# Patient Record
Sex: Female | Born: 1960 | Race: White | Hispanic: No | Marital: Married | Smoking: Never smoker
Health system: Southern US, Community
[De-identification: ages and names within clinical notes are randomized; demographics above are authoritative.]

## PROBLEM LIST (undated history)

## (undated) DIAGNOSIS — N189 Chronic kidney disease, unspecified: Secondary | ICD-10-CM

## (undated) HISTORY — PX: BREAST SURGERY: SHX581

## (undated) HISTORY — PX: ABDOMINAL HYSTERECTOMY: SHX81

---
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
## 1998-10-20 ENCOUNTER — Other Ambulatory Visit: Admission: RE | Admit: 1998-10-20 | Discharge: 1998-10-20 | Payer: Self-pay | Admitting: Obstetrics & Gynecology

## 1999-12-03 ENCOUNTER — Other Ambulatory Visit: Admission: RE | Admit: 1999-12-03 | Discharge: 1999-12-03 | Payer: Self-pay | Admitting: Obstetrics & Gynecology

## 2001-02-03 ENCOUNTER — Other Ambulatory Visit: Admission: RE | Admit: 2001-02-03 | Discharge: 2001-02-03 | Payer: Self-pay | Admitting: Obstetrics and Gynecology

## 2003-03-04 ENCOUNTER — Other Ambulatory Visit: Admission: RE | Admit: 2003-03-04 | Discharge: 2003-03-04 | Payer: Self-pay | Admitting: Obstetrics and Gynecology

## 2004-05-04 ENCOUNTER — Other Ambulatory Visit: Admission: RE | Admit: 2004-05-04 | Discharge: 2004-05-04 | Payer: Self-pay | Admitting: Obstetrics and Gynecology

## 2004-08-20 ENCOUNTER — Encounter: Admission: RE | Admit: 2004-08-20 | Discharge: 2004-08-20 | Payer: Self-pay | Admitting: Obstetrics and Gynecology

## 2005-06-02 ENCOUNTER — Other Ambulatory Visit: Admission: RE | Admit: 2005-06-02 | Discharge: 2005-06-02 | Payer: Self-pay | Admitting: Obstetrics and Gynecology

## 2011-01-10 ENCOUNTER — Encounter: Payer: Self-pay | Admitting: Obstetrics and Gynecology

## 2015-12-25 ENCOUNTER — Encounter (HOSPITAL_COMMUNITY): Payer: Self-pay | Admitting: Emergency Medicine

## 2015-12-25 ENCOUNTER — Emergency Department (HOSPITAL_COMMUNITY)
Admission: EM | Admit: 2015-12-25 | Discharge: 2015-12-25 | Disposition: A | Discharge status: Home / Self Care | Payer: BLUE CROSS/BLUE SHIELD | Attending: Emergency Medicine | Admitting: Emergency Medicine

## 2015-12-25 ENCOUNTER — Emergency Department (HOSPITAL_COMMUNITY): Payer: BLUE CROSS/BLUE SHIELD

## 2015-12-25 DIAGNOSIS — N390 Urinary tract infection, site not specified: Secondary | ICD-10-CM | POA: Insufficient documentation

## 2015-12-25 DIAGNOSIS — N200 Calculus of kidney: Secondary | ICD-10-CM | POA: Insufficient documentation

## 2015-12-25 LAB — CBC WITH DIFFERENTIAL/PLATELET
Basophils Absolute: 0 10*3/uL (ref 0.0–0.1)
Basophils Relative: 1 %
Eosinophils Absolute: 0.2 10*3/uL (ref 0.0–0.7)
Eosinophils Relative: 3 %
HEMATOCRIT: 37.2 % (ref 36.0–46.0)
HEMOGLOBIN: 12.2 g/dL (ref 12.0–15.0)
LYMPHS ABS: 1.3 10*3/uL (ref 0.7–4.0)
LYMPHS PCT: 28 %
MCH: 31.8 pg (ref 26.0–34.0)
MCHC: 32.8 g/dL (ref 30.0–36.0)
MCV: 96.9 fL (ref 78.0–100.0)
MONOS PCT: 8 %
Monocytes Absolute: 0.4 10*3/uL (ref 0.1–1.0)
NEUTROS ABS: 2.9 10*3/uL (ref 1.7–7.7)
NEUTROS PCT: 60 %
Platelets: 194 10*3/uL (ref 150–400)
RBC: 3.84 MIL/uL — AB (ref 3.87–5.11)
RDW: 12 % (ref 11.5–15.5)
WBC: 4.8 10*3/uL (ref 4.0–10.5)

## 2015-12-25 LAB — URINALYSIS, ROUTINE W REFLEX MICROSCOPIC
BILIRUBIN URINE: NEGATIVE
GLUCOSE, UA: NEGATIVE mg/dL
Hgb urine dipstick: NEGATIVE
KETONES UR: NEGATIVE mg/dL
NITRITE: POSITIVE — AB
PROTEIN: NEGATIVE mg/dL
Specific Gravity, Urine: 1.02 (ref 1.005–1.030)
pH: 7 (ref 5.0–8.0)

## 2015-12-25 LAB — URINE MICROSCOPIC-ADD ON: RBC / HPF: NONE SEEN RBC/hpf (ref 0–5)

## 2015-12-25 LAB — COMPREHENSIVE METABOLIC PANEL
ALK PHOS: 76 U/L (ref 38–126)
ALT: 11 U/L — AB (ref 14–54)
ANION GAP: 10 (ref 5–15)
AST: 20 U/L (ref 15–41)
Albumin: 4.3 g/dL (ref 3.5–5.0)
BILIRUBIN TOTAL: 0.4 mg/dL (ref 0.3–1.2)
BUN: 13 mg/dL (ref 6–20)
CALCIUM: 9.5 mg/dL (ref 8.9–10.3)
CO2: 26 mmol/L (ref 22–32)
CREATININE: 0.82 mg/dL (ref 0.44–1.00)
Chloride: 105 mmol/L (ref 101–111)
GFR calc non Af Amer: >60 mL/min (ref >60)
Glucose, Bld: 133 mg/dL — ABNORMAL HIGH (ref 65–99)
Potassium: 4.1 mmol/L (ref 3.5–5.1)
Sodium: 141 mmol/L (ref 135–145)
TOTAL PROTEIN: 6.7 g/dL (ref 6.5–8.1)

## 2015-12-25 MED ORDER — ACETAMINOPHEN 10 MG/ML IV SOLN
1000.0000 mg | Freq: Four times a day (QID) | INTRAVENOUS | Status: DC
  Administered 2015-12-25: 1000 mg via INTRAVENOUS
  Filled 2015-12-25 (×4): qty 100

## 2015-12-25 MED ORDER — ONDANSETRON HCL 4 MG/2ML IJ SOLN
4.0000 mg | Freq: Once | INTRAMUSCULAR | Status: AC
  Administered 2015-12-25: 4 mg via INTRAVENOUS
  Filled 2015-12-25: qty 2

## 2015-12-25 MED ORDER — DIPHENHYDRAMINE HCL 50 MG/ML IJ SOLN
25.0000 mg | Freq: Once | INTRAMUSCULAR | Status: AC
  Administered 2015-12-25: 25 mg via INTRAVENOUS
  Filled 2015-12-25: qty 1

## 2015-12-25 MED ORDER — HYDROMORPHONE HCL 1 MG/ML IJ SOLN
1.0000 mg | Freq: Once | INTRAMUSCULAR | Status: AC
  Administered 2015-12-25: 1 mg via INTRAVENOUS
  Filled 2015-12-25: qty 1

## 2015-12-25 MED ORDER — HYDROMORPHONE HCL 1 MG/ML IJ SOLN
1.0000 mg | Freq: Once | INTRAMUSCULAR | Status: AC
  Administered 2015-12-25: 1 mg via INTRAVENOUS

## 2015-12-25 MED ORDER — KETOROLAC TROMETHAMINE 30 MG/ML IJ SOLN
30.0000 mg | Freq: Once | INTRAMUSCULAR | Status: AC
  Administered 2015-12-25: 30 mg via INTRAVENOUS
  Filled 2015-12-25: qty 1

## 2015-12-25 MED ORDER — OXYCODONE-ACETAMINOPHEN 5-325 MG PO TABS
1.0000 | ORAL_TABLET | Freq: Once | ORAL | Status: AC
  Administered 2015-12-25: 2 via ORAL

## 2015-12-25 MED ORDER — HYDROCODONE-ACETAMINOPHEN 5-325 MG PO TABS: 1.0000 | ORAL_TABLET | Freq: Four times a day (QID) | ORAL | Status: AC | PRN

## 2015-12-25 MED ORDER — OXYCODONE-ACETAMINOPHEN 5-325 MG PO TABS
ORAL_TABLET | ORAL | Status: AC
  Filled 2015-12-25: qty 2

## 2015-12-25 MED ORDER — CEPHALEXIN 500 MG PO CAPS: 500.0000 mg | ORAL_CAPSULE | Freq: Four times a day (QID) | ORAL | Status: DC

## 2015-12-25 MED ORDER — HYDROMORPHONE HCL 1 MG/ML IJ SOLN
0.5000 mg | Freq: Once | INTRAMUSCULAR | Status: DC
  Filled 2015-12-25: qty 1

## 2015-12-25 MED ORDER — ONDANSETRON HCL 4 MG PO TABS: 4.0000 mg | ORAL_TABLET | Freq: Four times a day (QID) | ORAL | Status: AC

## 2015-12-25 NOTE — ED Provider Notes (Signed)
CSN: [id number]     Arrival date & time 12/25/15  1032 History  By signing my name below, I, Ellen Haney, attest that this documentation has been prepared under the direction and in the presence of Ellen Horsemanobert Bethenny Losee, PA-C Electronically Signed: Charline BillsEssence Haney, ED Scribe 12/25/2015 at 1:49 PM.   Chief Complaint  Patient presents with  . Abdominal Pain   The history is provided by the patient. No language interpreter was used.   HPI Comments: Ellen Haney is a 55 y.o. female who presents to the Emergency Department with a chief complaint of sudden onset of constant left-sided abdominal pain that radiates into her left back onset 1 hour ago. Pt reports associated nausea and urine malodor. She denies dysuria. Pt reports h/o kindey stones and states that this feels similar. Previous surgeries include hysterectomy and caesarean section.   History reviewed. No pertinent past medical history. History reviewed. No pertinent past surgical history. History reviewed. No pertinent family history. Social History  Substance Use Topics  . Smoking status: Never Smoker   . Smokeless tobacco: None  . Alcohol Use: No   OB History    No data available     Review of Systems  Gastrointestinal: Positive for nausea and abdominal pain.  Genitourinary: Negative for dysuria.  Musculoskeletal: Positive for back pain.   Allergies  Review of patient's allergies indicates not on file.  Home Medications   Prior to Admission medications   Not on File   Temp(Src) 97.6 F (36.4 C) (Oral)  Ht 5\' 4"  (1.626 m)  Wt 118 lb (53.524 kg)  BMI 20.24 kg/m2  SpO2 99%  LMP  Physical Exam  Constitutional: She is oriented to person, place, and time. She appears well-developed and well-nourished.  HENT:  Head: Normocephalic and atraumatic.  Eyes: Conjunctivae and EOM are normal. Pupils are equal, round, and reactive to light.  Neck: Normal range of motion. Neck supple.  Cardiovascular: Normal rate and regular rhythm.   Exam reveals no gallop and no friction rub.   No murmur heard. Pulmonary/Chest: Effort normal and breath sounds normal. No respiratory distress. She has no wheezes. She has no rales. She exhibits no tenderness.  Abdominal: Soft. Bowel sounds are normal. She exhibits no distension and no mass. There is no tenderness. There is no rebound and no guarding.  No focal abdominal tenderness, no RLQ tenderness or pain at McBurney's point, no RUQ tenderness or Murphy's sign, no left-sided abdominal tenderness, no fluid wave, or signs of peritonitis  Left CVA tenderness   Musculoskeletal: Normal range of motion. She exhibits no edema or tenderness.  Neurological: She is alert and oriented to person, place, and time.  Skin: Skin is warm and dry.  Psychiatric: She has a normal mood and affect. Her behavior is normal. Judgment and thought content normal.  Nursing note and vitals reviewed.  ED Course  Procedures (including critical care time) DIAGNOSTIC STUDIES: Oxygen Saturation is 99% on RA, normal by my interpretation.    COORDINATION OF CARE: 11:22 AM-Discussed treatment plan which includes CBC, UA, CT abdomen pelvis, Zofran, Percocet, Dilaudid, Toradol injection and Ofirmev with pt at bedside and pt agreed to plan.   Labs Review Labs Reviewed  CBC WITH DIFFERENTIAL/PLATELET - Abnormal; Notable for the following:    RBC 3.84 (*)    All other components within normal limits  COMPREHENSIVE METABOLIC PANEL - Abnormal; Notable for the following:    Glucose, Bld 133 (*)    ALT 11 (*)    All  other components within normal limits  URINALYSIS, ROUTINE W REFLEX MICROSCOPIC (NOT AT [facility]) - Abnormal; Notable for the following:    APPearance CLOUDY (*)    Nitrite POSITIVE (*)    Leukocytes, UA SMALL (*)    All other components within normal limits  URINE MICROSCOPIC-ADD ON - Abnormal; Notable for the following:    Squamous Epithelial / LPF 0-5 (*)    Bacteria, UA MANY (*)    All other components  within normal limits   Imaging Review Ct Abdomen Pelvis Wo Contrast  12/25/2015  CLINICAL DATA:  Left flank pain EXAM: CT ABDOMEN AND PELVIS WITHOUT CONTRAST TECHNIQUE: Multidetector CT imaging of the abdomen and pelvis was performed following the standard protocol without IV contrast. COMPARISON:  None. FINDINGS: Lower chest: Mild scarring in the lung bases and right middle lobe. No infiltrate or effusion. Heart size within normal limits. Hepatobiliary: Liver normal in size and contour without focal lesion. Gallbladder and bile ducts normal. Pancreas: Negative Spleen: Negative Adrenals/Urinary Tract: Moderate hydronephrosis on the left. Left ureter is dilated. 3 x 10 mm stone is present in the distal left ureter causing obstruction. This is present at the left UVJ. No obstruction of the right kidney. Nephrocalcinosis is present in the bilaterally right greater than left. No renal mass. Urinary bladder empty. Stomach/Bowel: Negative for bowel obstruction. No bowel edema or mass. Normal appendix. Vascular/Lymphatic: Normal aorta and iliacs.  No lymphadenopathy Reproductive: Mild to moderate prostate enlargement. Other: Negative for free fluid Musculoskeletal: No fracture or bone lesion. IMPRESSION: 3 x 10 mm stone distal left ureter causing obstruction of the left kidney. Bilateral nephrocalcinosis right greater than left. Electronically Signed   By: Marlan Palau M.D.   On: 12/25/2015 13:08   I have personally reviewed and evaluated these images and lab results as part of my medical decision-making.   EKG Interpretation None      MDM   Final diagnoses:  Kidney stone  UTI (lower urinary tract infection)    Patient with sudden onset left flank pain this morning. She has a history of kidney stones. States that this feels similar.  CT scan remarkable for 3 x 10 mm left distally ureteral stone. I discussed this with Dr. Verdie Mosher, who recommends consultation with urology.  Pain is initially well  controlled with Dilaudid, however after about 30 minutes, and the pain is back and severe.  We'll give a second dose of Dilaudid.  Appreciate telephone consultation with Dr. Vernie Ammons, who recommends giving Toradol and IV Tylenol. If pain is still intolerable, we'll call him back, otherwise follow-up in his office in the next couple of days. I reviewed the UA with Dr. Vernie Ammons as well, who recommends treatment for UTI, but believes the infection to be beneath the stone, so not infected calculus. No fevers, chills, leukocytosis, or AKI.    Patient has good pain control after toradol, but had itching.  Patient given benadryl.  IV tylenol held because of allergic reaction to toradol.  Will wait and reassess.  3:14 PM Patient pain still well controlled.  Will DC IV tylenol.  I personally performed the services described in this documentation, which was scribed in my presence. The recorded information has been reviewed and is accurate.      Ellen Horseman, PA-C 12/25/15 1515  Lavera Guise, MD 12/26/15 0630

## 2015-12-25 NOTE — ED Notes (Signed)
Patient transported to CT 

## 2015-12-25 NOTE — Discharge Instructions (Signed)
Kidney Stones °Kidney stones (urolithiasis) are deposits that form inside your kidneys. The intense pain is caused by the stone moving through the urinary tract. When the stone moves, the ureter goes into spasm around the stone. The stone is usually passed in the urine.  °CAUSES  °· A disorder that makes certain neck glands produce too much parathyroid hormone (primary hyperparathyroidism). °· A buildup of uric acid crystals, similar to gout in your joints. °· Narrowing (stricture) of the ureter. °· A kidney obstruction present at birth (congenital obstruction). °· Previous surgery on the kidney or ureters. °· Numerous kidney infections. °SYMPTOMS  °· Feeling sick to your stomach (nauseous). °· Throwing up (vomiting). °· Blood in the urine (hematuria). °· Pain that usually spreads (radiates) to the groin. °· Frequency or urgency of urination. °DIAGNOSIS  °· Taking a history and physical exam. °· Blood or urine tests. °· CT scan. °· Occasionally, an examination of the inside of the urinary bladder (cystoscopy) is performed. °TREATMENT  °· Observation. °· Increasing your fluid intake. °· Extracorporeal shock wave lithotripsy--This is a noninvasive procedure that uses shock waves to break up kidney stones. °· Surgery may be needed if you have severe pain or persistent obstruction. There are various surgical procedures. Most of the procedures are performed with the use of small instruments. Only small incisions are needed to accommodate these instruments, so recovery time is minimized. °The size, location, and chemical composition are all important variables that will determine the proper choice of action for you. Talk to your health care provider to better understand your situation so that you will minimize the risk of injury to yourself and your kidney.  °HOME CARE INSTRUCTIONS  °· Drink enough water and fluids to keep your urine clear or pale yellow. This will help you to pass the stone or stone fragments. °· Strain  all urine through the provided strainer. Keep all particulate matter and stones for your health care provider to see. The stone causing the pain may be as small as a grain of salt. It is very important to use the strainer each and every time you pass your urine. The collection of your stone will allow your health care provider to analyze it and verify that a stone has actually passed. The stone analysis will often identify what you can do to reduce the incidence of recurrences. °· Only take over-the-counter or prescription medicines for pain, discomfort, or fever as directed by your health care provider. °· Keep all follow-up visits as told by your health care provider. This is important. °· Get follow-up X-rays if required. The absence of pain does not always mean that the stone has passed. It may have only stopped moving. If the urine remains completely obstructed, it can cause loss of kidney function or even complete destruction of the kidney. It is your responsibility to make sure X-rays and follow-ups are completed. Ultrasounds of the kidney can show blockages and the status of the kidney. Ultrasounds are not associated with any radiation and can be performed easily in a matter of minutes. °· Make changes to your daily diet as told by your health care provider. You may be told to: °¨ Limit the amount of salt that you eat. °¨ Eat 5 or more servings of fruits and vegetables each day. °¨ Limit the amount of meat, poultry, fish, and eggs that you eat. °· Collect a 24-hour urine sample as told by your health care provider. You may need to collect another urine sample every 6-12   months. °SEEK MEDICAL CARE IF: °· You experience pain that is progressive and unresponsive to any pain medicine you have been prescribed. °SEEK IMMEDIATE MEDICAL CARE IF:  °· Pain cannot be controlled with the prescribed medicine. °· You have a fever or shaking chills. °· The severity or intensity of pain increases over 18 hours and is not  relieved by pain medicine. °· You develop a new onset of abdominal pain. °· You feel faint or pass out. °· You are unable to urinate. °  °This information is not intended to replace advice given to you by your health care provider. Make sure you discuss any questions you have with your health care provider. °  °Document Released: 12/06/2005 Document Revised: 08/27/2015 Document Reviewed: 05/09/2013 °Elsevier Interactive Patient Education ©2016 [employer] ° °

## 2015-12-25 NOTE — ED Notes (Signed)
PT reports itching over entire body. Orders given for IV meds.

## 2015-12-25 NOTE — ED Notes (Signed)
Patient states left sided abd and back back for past hour

## 2015-12-25 NOTE — ED Notes (Signed)
Patient ambulated to bathroom and around department without difficulty

## 2015-12-26 ENCOUNTER — Ambulatory Visit (HOSPITAL_BASED_OUTPATIENT_CLINIC_OR_DEPARTMENT_OTHER)
Admission: RE | Admit: 2015-12-26 | Discharge: 2015-12-26 | Disposition: A | Discharge status: Home / Self Care | Payer: BLUE CROSS/BLUE SHIELD | Source: Ambulatory Visit | Attending: Urology | Admitting: Urology

## 2015-12-26 ENCOUNTER — Encounter (HOSPITAL_BASED_OUTPATIENT_CLINIC_OR_DEPARTMENT_OTHER): Payer: Self-pay | Admitting: *Deleted

## 2015-12-26 ENCOUNTER — Ambulatory Visit (HOSPITAL_BASED_OUTPATIENT_CLINIC_OR_DEPARTMENT_OTHER): Payer: BLUE CROSS/BLUE SHIELD | Admitting: Anesthesiology

## 2015-12-26 ENCOUNTER — Other Ambulatory Visit: Payer: Self-pay | Admitting: Urology

## 2015-12-26 ENCOUNTER — Encounter (HOSPITAL_BASED_OUTPATIENT_CLINIC_OR_DEPARTMENT_OTHER)
Admission: RE | Disposition: A | Discharge status: Home / Self Care | Payer: Self-pay | Source: Ambulatory Visit | Attending: Urology

## 2015-12-26 DIAGNOSIS — Z79891 Long term (current) use of opiate analgesic: Secondary | ICD-10-CM | POA: Diagnosis not present

## 2015-12-26 DIAGNOSIS — N201 Calculus of ureter: Secondary | ICD-10-CM | POA: Insufficient documentation

## 2015-12-26 DIAGNOSIS — Z841 Family history of disorders of kidney and ureter: Secondary | ICD-10-CM | POA: Diagnosis not present

## 2015-12-26 DIAGNOSIS — Z79899 Other long term (current) drug therapy: Secondary | ICD-10-CM | POA: Diagnosis not present

## 2015-12-26 DIAGNOSIS — Z87442 Personal history of urinary calculi: Secondary | ICD-10-CM | POA: Diagnosis not present

## 2015-12-26 DIAGNOSIS — N390 Urinary tract infection, site not specified: Secondary | ICD-10-CM | POA: Insufficient documentation

## 2015-12-26 HISTORY — PX: CYSTOSCOPY WITH URETEROSCOPY AND STENT PLACEMENT: SHX6377

## 2015-12-26 HISTORY — DX: Chronic kidney disease, unspecified: N18.9

## 2015-12-26 SURGERY — CYSTOURETEROSCOPY, WITH STENT INSERTION
Anesthesia: General | Site: Ureter | Laterality: Left

## 2015-12-26 MED ORDER — LACTATED RINGERS IV SOLN
INTRAVENOUS | Status: DC
  Administered 2015-12-26 (×2): via INTRAVENOUS
  Filled 2015-12-26: qty 1000

## 2015-12-26 MED ORDER — PHENYLEPHRINE HCL 10 MG/ML IJ SOLN
INTRAMUSCULAR | Status: DC | PRN
  Administered 2015-12-26: 40 ug via INTRAVENOUS

## 2015-12-26 MED ORDER — FENTANYL CITRATE (PF) 100 MCG/2ML IJ SOLN
INTRAMUSCULAR | Status: AC
  Filled 2015-12-26: qty 2

## 2015-12-26 MED ORDER — SODIUM CHLORIDE 0.9 % IR SOLN
Status: DC | PRN
  Administered 2015-12-26: 1000 mL
  Administered 2015-12-26: 3000 mL

## 2015-12-26 MED ORDER — CEFAZOLIN SODIUM-DEXTROSE 2-3 GM-% IV SOLR
INTRAVENOUS | Status: AC
  Filled 2015-12-26: qty 50

## 2015-12-26 MED ORDER — PROMETHAZINE HCL 25 MG/ML IJ SOLN
6.2500 mg | INTRAMUSCULAR | Status: DC | PRN
  Filled 2015-12-26: qty 1

## 2015-12-26 MED ORDER — MIDAZOLAM HCL 5 MG/5ML IJ SOLN
INTRAMUSCULAR | Status: DC | PRN
  Administered 2015-12-26: 2 mg via INTRAVENOUS

## 2015-12-26 MED ORDER — CIPROFLOXACIN IN D5W 400 MG/200ML IV SOLN
INTRAVENOUS | Status: AC
  Filled 2015-12-26: qty 200

## 2015-12-26 MED ORDER — HYDROMORPHONE HCL 1 MG/ML IJ SOLN
0.2500 mg | INTRAMUSCULAR | Status: DC | PRN
  Filled 2015-12-26: qty 1

## 2015-12-26 MED ORDER — FENTANYL CITRATE (PF) 100 MCG/2ML IJ SOLN
25.0000 ug | INTRAMUSCULAR | Status: AC | PRN
  Administered 2015-12-26 (×4): 25 ug via INTRAVENOUS
  Filled 2015-12-26: qty 0.5

## 2015-12-26 MED ORDER — PHENAZOPYRIDINE HCL 200 MG PO TABS
200.0000 mg | ORAL_TABLET | Freq: Three times a day (TID) | ORAL | Status: DC
  Administered 2015-12-26: 200 mg via ORAL
  Filled 2015-12-26: qty 1

## 2015-12-26 MED ORDER — FENTANYL CITRATE (PF) 100 MCG/2ML IJ SOLN
25.0000 ug | INTRAMUSCULAR | Status: DC | PRN
  Filled 2015-12-26: qty 1

## 2015-12-26 MED ORDER — PHENYLEPHRINE 40 MCG/ML (10ML) SYRINGE FOR IV PUSH (FOR BLOOD PRESSURE SUPPORT)
PREFILLED_SYRINGE | INTRAVENOUS | Status: AC
  Filled 2015-12-26: qty 10

## 2015-12-26 MED ORDER — EPHEDRINE SULFATE 50 MG/ML IJ SOLN
INTRAMUSCULAR | Status: DC | PRN
  Administered 2015-12-26: 15 mg via INTRAVENOUS
  Administered 2015-12-26: 10 mg via INTRAVENOUS

## 2015-12-26 MED ORDER — ONDANSETRON HCL 4 MG/2ML IJ SOLN
INTRAMUSCULAR | Status: AC
  Filled 2015-12-26: qty 2

## 2015-12-26 MED ORDER — ONDANSETRON HCL 4 MG/2ML IJ SOLN
INTRAMUSCULAR | Status: DC | PRN
  Administered 2015-12-26: 4 mg via INTRAVENOUS

## 2015-12-26 MED ORDER — DEXAMETHASONE SODIUM PHOSPHATE 10 MG/ML IJ SOLN
INTRAMUSCULAR | Status: AC
  Filled 2015-12-26: qty 1

## 2015-12-26 MED ORDER — FENTANYL CITRATE (PF) 100 MCG/2ML IJ SOLN
INTRAMUSCULAR | Status: DC | PRN
  Administered 2015-12-26 (×2): 25 ug via INTRAVENOUS

## 2015-12-26 MED ORDER — ACETAMINOPHEN 325 MG PO TABS
650.0000 mg | ORAL_TABLET | ORAL | Status: DC | PRN
  Filled 2015-12-26: qty 2

## 2015-12-26 MED ORDER — PHENAZOPYRIDINE HCL 100 MG PO TABS
ORAL_TABLET | ORAL | Status: AC
  Filled 2015-12-26: qty 2

## 2015-12-26 MED ORDER — ACETAMINOPHEN 650 MG RE SUPP
650.0000 mg | RECTAL | Status: DC | PRN
  Filled 2015-12-26: qty 1

## 2015-12-26 MED ORDER — OXYCODONE HCL 5 MG PO TABS
ORAL_TABLET | ORAL | Status: AC
  Filled 2015-12-26: qty 1

## 2015-12-26 MED ORDER — PROPOFOL 10 MG/ML IV BOLUS
INTRAVENOUS | Status: DC | PRN
  Administered 2015-12-26: 130 mg via INTRAVENOUS

## 2015-12-26 MED ORDER — DEXAMETHASONE SODIUM PHOSPHATE 4 MG/ML IJ SOLN
INTRAMUSCULAR | Status: DC | PRN
  Administered 2015-12-26: 10 mg via INTRAVENOUS

## 2015-12-26 MED ORDER — SODIUM CHLORIDE 0.9 % IJ SOLN
3.0000 mL | Freq: Two times a day (BID) | INTRAMUSCULAR | Status: DC
  Filled 2015-12-26: qty 3

## 2015-12-26 MED ORDER — LIDOCAINE HCL (CARDIAC) 20 MG/ML IV SOLN
INTRAVENOUS | Status: AC
  Filled 2015-12-26: qty 5

## 2015-12-26 MED ORDER — ONDANSETRON HCL 4 MG/2ML IJ SOLN
4.0000 mg | Freq: Once | INTRAMUSCULAR | Status: AC
  Administered 2015-12-26: 4 mg via INTRAVENOUS
  Filled 2015-12-26: qty 2

## 2015-12-26 MED ORDER — SODIUM CHLORIDE 0.9 % IJ SOLN
3.0000 mL | INTRAMUSCULAR | Status: DC | PRN
  Filled 2015-12-26: qty 3

## 2015-12-26 MED ORDER — METOCLOPRAMIDE HCL 5 MG/ML IJ SOLN
INTRAMUSCULAR | Status: DC | PRN
  Administered 2015-12-26: 5 mg via INTRAVENOUS

## 2015-12-26 MED ORDER — METOCLOPRAMIDE HCL 5 MG/ML IJ SOLN
INTRAMUSCULAR | Status: AC
  Filled 2015-12-26: qty 2

## 2015-12-26 MED ORDER — LIDOCAINE HCL (CARDIAC) 20 MG/ML IV SOLN
INTRAVENOUS | Status: DC | PRN
  Administered 2015-12-26: 80 mg via INTRAVENOUS

## 2015-12-26 MED ORDER — IOHEXOL 350 MG/ML SOLN
INTRAVENOUS | Status: DC | PRN
  Administered 2015-12-26: 20 mL

## 2015-12-26 MED ORDER — SODIUM CHLORIDE 0.9 % IV SOLN
250.0000 mL | INTRAVENOUS | Status: DC | PRN
  Filled 2015-12-26: qty 250

## 2015-12-26 MED ORDER — EPHEDRINE SULFATE 50 MG/ML IJ SOLN
INTRAMUSCULAR | Status: AC
  Filled 2015-12-26: qty 1

## 2015-12-26 MED ORDER — OXYCODONE HCL 5 MG PO TABS
5.0000 mg | ORAL_TABLET | ORAL | Status: DC | PRN
  Administered 2015-12-26: 5 mg via ORAL
  Filled 2015-12-26: qty 2

## 2015-12-26 MED ORDER — CIPROFLOXACIN HCL 500 MG PO TABS: 500.0000 mg | ORAL_TABLET | Freq: Two times a day (BID) | ORAL | Status: AC

## 2015-12-26 MED ORDER — PHENAZOPYRIDINE HCL 200 MG PO TABS: 200.0000 mg | ORAL_TABLET | Freq: Three times a day (TID) | ORAL | Status: AC | PRN

## 2015-12-26 MED ORDER — MIDAZOLAM HCL 2 MG/2ML IJ SOLN
INTRAMUSCULAR | Status: AC
  Filled 2015-12-26: qty 2

## 2015-12-26 MED ORDER — PROPOFOL 10 MG/ML IV BOLUS
INTRAVENOUS | Status: AC
  Filled 2015-12-26: qty 20

## 2015-12-26 SURGICAL SUPPLY — 42 items
BAG DRAIN URO-CYSTO SKYTR STRL (DRAIN) ×3 IMPLANT
BAG DRN UROCATH (DRAIN) ×1
BASKET DAKOTA 1.9FR 11X120 (BASKET) ×2 IMPLANT
BASKET LASER NITINOL 1.9FR (BASKET) IMPLANT
BASKET SEGURA 3FR (UROLOGICAL SUPPLIES) IMPLANT
BASKET STNLS GEMINI 4WIRE 3FR (BASKET) IMPLANT
BASKET ZERO TIP NITINOL 2.4FR (BASKET) IMPLANT
BSKT STON RTRVL 120 1.9FR (BASKET)
BSKT STON RTRVL GEM 120X11 3FR (BASKET)
BSKT STON RTRVL ZERO TP 2.4FR (BASKET)
CATH URET 5FR 28IN CONE TIP (BALLOONS)
CATH URET 5FR 28IN OPEN ENDED (CATHETERS) IMPLANT
CATH URET 5FR 70CM CONE TIP (BALLOONS) IMPLANT
CLOTH BEACON ORANGE TIMEOUT ST (SAFETY) ×3 IMPLANT
ELECT REM PT RETURN 9FT ADLT (ELECTROSURGICAL)
ELECTRODE REM PT RTRN 9FT ADLT (ELECTROSURGICAL) IMPLANT
FIBER LASER FLEXIVA 1000 (UROLOGICAL SUPPLIES) IMPLANT
FIBER LASER FLEXIVA 365 (UROLOGICAL SUPPLIES) IMPLANT
FIBER LASER FLEXIVA 550 (UROLOGICAL SUPPLIES) IMPLANT
FIBER LASER TRAC TIP (UROLOGICAL SUPPLIES) IMPLANT
GLOVE SURG SS PI 8.0 STRL IVOR (GLOVE) ×3 IMPLANT
GOWN STRL REUS W/ TWL LRG LVL3 (GOWN DISPOSABLE) ×1 IMPLANT
GOWN STRL REUS W/ TWL XL LVL3 (GOWN DISPOSABLE) ×1 IMPLANT
GOWN STRL REUS W/TWL LRG LVL3 (GOWN DISPOSABLE) ×3
GOWN STRL REUS W/TWL XL LVL3 (GOWN DISPOSABLE) ×3
GUIDEWIRE 0.038 PTFE COATED (WIRE) IMPLANT
GUIDEWIRE ANG ZIPWIRE 038X150 (WIRE) IMPLANT
GUIDEWIRE STR DUAL SENSOR (WIRE) ×3 IMPLANT
IV NS 1000ML (IV SOLUTION) ×3
IV NS 1000ML BAXH (IV SOLUTION) ×1 IMPLANT
IV NS IRRIG 3000ML ARTHROMATIC (IV SOLUTION) ×3 IMPLANT
KIT BALLIN UROMAX 15FX10 (LABEL) IMPLANT
KIT BALLN UROMAX 15FX4 (MISCELLANEOUS) IMPLANT
KIT BALLN UROMAX 26 75X4 (MISCELLANEOUS)
KIT ROOM TURNOVER WOR (KITS) ×3 IMPLANT
MANIFOLD NEPTUNE II (INSTRUMENTS) IMPLANT
PACK CYSTO (CUSTOM PROCEDURE TRAY) ×3 IMPLANT
SET HIGH PRES BAL DIL (LABEL)
SHEATH ACCESS URETERAL 38CM (SHEATH) IMPLANT
STENT URET 6FRX24 CONTOUR (STENTS) ×2 IMPLANT
TUBE CONNECTING 12'X1/4 (SUCTIONS)
TUBE CONNECTING 12X1/4 (SUCTIONS) IMPLANT

## 2015-12-26 NOTE — Transfer of Care (Signed)
Last Vitals:  Filed Vitals:   12/26/15 0951  BP: 108/75  Pulse: 72  Temp: 37.1 C  Resp: 16  Immediate Anesthesia Transfer of Care Note  Patient: Ellen Haney  Procedure(s) Performed: Procedure(s) (LRB): CYSTOSCOPY WITH URETEROSCOPY AND STENT PLACEMENT (Left)  Patient Location: PACU  Anesthesia Type: General  Level of Consciousness: awake, alert  and oriented  Airway & Oxygen Therapy: Patient Spontanous Breathing and Patient connected to face mask oxygen  Post-op Assessment: Report given to PACU RN and Post -op Vital signs reviewed and stable  Post vital signs: Reviewed and stable  Complications: No apparent anesthesia complications

## 2015-12-26 NOTE — Anesthesia Preprocedure Evaluation (Addendum)
Anesthesia Evaluation  Patient identified by MRN, date of birth, ID band Patient awake    Reviewed: Allergy & Precautions, H&P , NPO status , Patient's Chart, lab work & pertinent test results  History of Anesthesia Complications Negative for: history of anesthetic complications  Airway Mallampati: I  TM Distance: >3 FB Neck ROM: Full    Dental no notable dental hx. (+) Teeth Intact   Pulmonary neg pulmonary ROS,    Pulmonary exam normal breath sounds clear to auscultation       Cardiovascular negative cardio ROS  I Rhythm:regular Rate:Normal     Neuro/Psych negative neurological ROS  negative psych ROS   GI/Hepatic negative GI ROS, Neg liver ROS,   Endo/Other  negative endocrine ROS  Renal/GU negative Renal ROS  negative genitourinary   Musculoskeletal   Abdominal   Peds  Hematology negative hematology ROS (+)   Anesthesia Other Findings   Reproductive/Obstetrics negative OB ROS                            Anesthesia Physical Anesthesia Plan  ASA: I  Anesthesia Plan: General and General LMA   Post-op Pain Management:    Induction:   Airway Management Planned:   Additional Equipment:   Intra-op Plan:   Post-operative Plan:   Informed Consent: I have reviewed the patients History and Physical, chart, labs and discussed the procedure including the risks, benefits and alternatives for the proposed anesthesia with the patient or authorized representative who has indicated his/her understanding and acceptance.     Plan Discussed with: CRNA and Surgeon  Anesthesia Plan Comments:         Anesthesia Quick Evaluation

## 2015-12-26 NOTE — Discharge Instructions (Addendum)
CYSTOSCOPY HOME CARE INSTRUCTIONS  Activity: Rest for the remainder of the day.  Do not drive or operate equipment today.  You may resume normal activities in one to two days as instructed by your physician.   Meals: Drink plenty of liquids and eat light foods such as gelatin or soup this evening.  You may return to a normal meal plan tomorrow.  Return to Work: You may return to work in one to two days or as instructed by your physician.  Special Instructions / Symptoms: Call your physician if any of these symptoms occur:   -persistent or heavy bleeding  -bleeding which continues after first few urination  -large blood clots that are difficult to pass  -urine stream diminishes or stops completely  -fever equal to or higher than 101 degrees Farenheit.  -cloudy urine with a strong, foul odor  -severe pain  Females should always wipe from front to back after elimination.  You may feel some burning pain when you urinate.  This should disappear with time.  Applying moist heat to the lower abdomen or a hot tub bath may help relieve the pain. \  You have a string at the urethral opening.  Try not to pull that out until Monday morning and if you don't feel comfortable doing it yourself you can come to the office to have it done.    I have changed the antibiotic to cipro.   Patient Signature:  ________________________________________________________  Nurse's Signature:  ________________________________________________________  [facility] [phone] Post Ureteroscopy With or Without Stent Instructions  Definitions:  Ureter: The duct that transports urine from the kidney to the bladder. Stent:   A plastic hollow tube that is placed into the ureter, from the kidney to the                 bladder to prevent the ureter from swelling shut.  GENERAL INSTRUCTIONS:  Despite the fact that no skin incisions were used, the area around the ureter and bladder is raw and  irritated. The stent is a foreign body which will further irritate the bladder wall. This irritation is manifested by increased frequency of urination, both day and night, and by an increase in the urge to urinate. In some, the urge to urinate is present almost always. Sometimes the urge is strong enough that you may not be able to stop yourself from urinating. The only real cure is to remove the stent and then give time for the bladder wall to heal which can't be done until the danger of the ureter swelling shut has passed, which varies.  You may see some blood in your urine while the stent is in place and a few days afterwards. Do not be alarmed, even if the urine was clear for a while. Get off your feet and drink lots of fluids until clearing occurs. If you start to pass clots or don't improve, call us.  DIET: You may return to your normal diet immediately. Because of the raw surface of your bladder, alcohol, spicy foods, acid type foods and drinks with caffeine may cause irritation or frequency and should be used in moderation. To keep your urine flowing freely and to avoid constipation, drink plenty of fluids during the day ( 8-10 glasses ). Tip: Avoid cranberry juice because it is very acidic.  ACTIVITY: Your physical activity doesn't need to be restricted. However, if you are very active, you may see some blood in your urine. We suggest that you reduce  your activity under these circumstances until the bleeding has stopped.  BOWELS: It is important to keep your bowels regular during the postoperative period. Straining with bowel movements can cause bleeding. A bowel movement every other day is reasonable. Use a mild laxative if needed, such as Milk of Magnesia 2-3 tablespoons, or 2 Dulcolax tablets. Call if you continue to have problems. If you have been taking narcotics for pain, before, during or after your surgery, you may be constipated. Take a laxative if necessary.   MEDICATION: You  should resume your pre-surgery medications unless told not to. In addition you will often be given an antibiotic to prevent infection. These should be taken as prescribed until the bottles are finished unless you are having an unusual reaction to one of the drugs.  PROBLEMS YOU SHOULD REPORT TO US:  Fevers over 100.5 Fahrenheit.  Heavy bleeding, or clots ( See above notes about blood in urine ).  Inability to urinate.  Drug reactions ( hives, rash, nausea, vomiting, diarrhea ).  Severe burning or pain with urination that is not improving.  FOLLOW-UP: You will need a follow-up appointment to monitor your progress. Call for this appointment at the number listed above. Usually the first appointment will be about three to fourteen days after your surgery.    Post Anesthesia Home Care Instructions  Activity: Get plenty of rest for the remainder of the day. A responsible adult should stay with you for 24 hours following the procedure.  For the next 24 hours, DO NOT: -Drive a car -Advertising copywriterperate machinery -Drink alcoholic beverages -Take any medication unless instructed by your physician -Make any legal decisions or sign important papers.  Meals: Start with liquid foods such as gelatin or soup. Progress to regular foods as tolerated. Avoid greasy, spicy, heavy foods. If nausea and/or vomiting occur, drink only clear liquids until the nausea and/or vomiting subsides. Call your physician if vomiting continues.  Special Instructions/Symptoms: Your throat may feel dry or sore from the anesthesia or the breathing tube placed in your throat during surgery. If this causes discomfort, gargle with warm salt water. The discomfort should disappear within 24 hours.  If you had a scopolamine patch placed behind your ear for the management of post- operative nausea and/or vomiting:  1. The medication in the patch is effective for 72 hours, after which it should be removed.  Wrap patch in a tissue and  discard in the trash. Wash hands thoroughly with soap and water. 2. You may remove the patch earlier than 72 hours if you experience unpleasant side effects which may include dry mouth, dizziness or visual disturbances. 3. Avoid touching the patch. Wash your hands with soap and water after contact with the patch.

## 2015-12-26 NOTE — Anesthesia Procedure Notes (Signed)
Procedure Name: LMA Insertion Date/Time: 12/26/2015 2:23 PM Performed by: Norva PavlovALLAWAY, Sabriya Yono G Pre-anesthesia Checklist: Patient identified, Emergency Drugs available, Suction available and Patient being monitored Patient Re-evaluated:Patient Re-evaluated prior to inductionOxygen Delivery Method: Circle System Utilized Preoxygenation: Pre-oxygenation with 100% oxygen Intubation Type: IV induction Ventilation: Mask ventilation without difficulty LMA: LMA inserted LMA Size: 4.0 Number of attempts: 1 Airway Equipment and Method: bite block Placement Confirmation: positive ETCO2 Tube secured with: Tape Dental Injury: Teeth and Oropharynx as per pre-operative assessment

## 2015-12-26 NOTE — H&P (Signed)
Active Problems Problems   1. Calculus of distal left ureter (N20.1)  2. Urinary tract infection (N39.0)  History of Present Illness  Ellen Haney is a 55 yo WF who is sent from the ER for a left distal stone.  She had the onset yesterday morning of left flank with radiation into the lower abdomen and bladder.   The pain was severe with nausea and she was seen in the ER where a CT showed a 3x45m LUVJ stone.  She has a history of stones and passed one 3 years ago.  She was also started on keflex for a possible UTI.  She had no dysuria but had malodorous  urine.  She has had prior UTI's but none recently.  She has had no GU surgery.  Her pain is somewhat controlled today.   Her urine still looks infected.  She hasn't had a fever.   Past Medical History Problems   1. History of renal calculi ((E42.353  Surgical History Problems   1. History of Cesarean Section  2. History of Hysterectomy  Current Meds  1. Cephalexin 500 MG Oral Tablet;  Therapy: (Recorded:06Jan2017) to Recorded  2. Hydrocodone-Acetaminophen 5-325 MG Oral Tablet;  Therapy: (Recorded:06Jan2017) to Recorded  3. Ondansetron HCl - 24 MG Oral Tablet;  Therapy: (Recorded:06Jan2017) to Recorded  Allergies Medication   1. Toradol  Family History Problems   1. Family history of nephrolithiasis (Z84.1) : Mother  2. Family history of Hematuria, microscopic : Mother  Social History Problems    Alcohol use (Z78.9)   6 per month   Caffeine use (F15.90)   1 per day   Married   Never a smoker   Number of children   3 sons  Review of Systems Genitourinary, constitutional, skin, eye, otolaryngeal, hematologic/lymphatic, cardiovascular, pulmonary, endocrine, musculoskeletal, gastrointestinal, neurological and psychiatric system(s) were reviewed and pertinent findings if present are noted and are otherwise negative.  Genitourinary: urinary urgency, urinary stream starts and stops and initiating urination requires  straining.  Gastrointestinal: nausea.  Musculoskeletal: back pain.    Vitals Vital Signs [Data Includes: Last 1 Day]  Recorded: [id number]:30AM  Height: 5 ft 4 in Weight: 115 lb  BMI Calculated: 19.74 BSA Calculated: 1.55 Blood Pressure: 93 / 62 Temperature: 98.3 F Heart Rate: 71  Physical Exam Constitutional: Well nourished and well developed . No acute distress.  ENT:. The ears and nose are normal in appearance.  Neck: The appearance of the neck is normal and no neck mass is present.  Pulmonary: No respiratory distress and normal respiratory rhythm and effort.  Cardiovascular: Heart rate and rhythm are normal . No peripheral edema.  Abdomen: The abdomen is flat. No masses are palpated. Moderate tenderness in the RLQ is present. moderate left CVA tenderness. No hernias are palpable. No hepatosplenomegaly noted.  Skin: Normal skin turgor, no visible rash and no visible skin lesions.  Neuro/Psych:. Mood and affect are appropriate.    Results/Data Urine [Data Includes: Last 1 Day]   [id number] COLOR YELLOW   APPEARANCE CLEAR   SPECIFIC GRAVITY >1.030   pH 5.5   GLUCOSE NEGATIVE   BILIRUBIN NEGATIVE   KETONE NEGATIVE   BLOOD 3+   PROTEIN 1+   NITRITE POSITIVE   LEUKOCYTE ESTERASE 1+   SQUAMOUS EPITHELIAL/HPF 6-10 HPF  WBC 20-40 WBC/HPF  RBC 0-2 RBC/HPF  BACTERIA MODERATE HPF  CRYSTALS NONE SEEN HPF  CASTS NONE SEEN LPF  Other SN   Yeast NONE SEEN HPF   Old records or  history reviewed: ER notes reviewed.  The following images/tracing/specimen were independently visualized:  CT films reviewed. KUB today shows a 3 x 56m LUVJ stone and left phleboliths. There are no other bone, gas or soft tissue abnormalities.  The following clinical lab reports were reviewed:  UA and ER labs reviewed.    Assessment Assessed   1. Calculus of distal left ureter (N20.1)  2. Urinary tract infection (N39.0)   She has a 3 x 161mLUVJ stone with pain. She appears to have a UTI but no  systemic symptoms.   Plan Calculus of distal left ureter   1. Follow-up Schedule Surgery Office  Follow-up  Status: Hold For - Appointment   Requested for: [id number]. KUB; Status:Resulted - Requires Verification;   Done: : [id number]:08AM Health Maintenance   3. UA With REFLEX; [Do Not Release]; Status:Resulted - Requires Verification;   Done: :  [id number]:09AM Urinary tract infection   4. URINE CULTURE; Status:In Progress - Specimen/Data Collected;   Done: : [id number] I have discussed the options for treatment including MET, ureteroscopy and ESWL but at this point I think ureteroscopy is most appropriate and reviewed the risks of bleeding, infection, ureteral injury, need for a stent and secondary procedures, thrombotic events and anesthetic complications. I have cultured her urine and given her [id number] Cipro po today since the UA is nit + despite the keflex.  She is aware that if there is pus above the stone, I will probably just place a stent.

## 2015-12-26 NOTE — Anesthesia Postprocedure Evaluation (Signed)
Anesthesia Post Note  Patient: Ellen Haney  Procedure(s) Performed: Procedure(s) (LRB): CYSTOSCOPY WITH URETEROSCOPY AND STENT PLACEMENT (Left)  Patient location during evaluation: PACU Anesthesia Type: General Level of consciousness: awake and alert Pain management: pain level controlled Vital Signs Assessment: post-procedure vital signs reviewed and stable Respiratory status: spontaneous breathing, nonlabored ventilation, respiratory function stable and patient connected to nasal cannula oxygen Cardiovascular status: blood pressure returned to baseline and stable Postop Assessment: no signs of nausea or vomiting Anesthetic complications: no    Last Vitals:  Filed Vitals:   12/26/15 1515 12/26/15 1530  BP: 106/63 102/67  Pulse: 101 103  Temp:    Resp: 16 16    Last Pain:  Filed Vitals:   12/26/15 1542  PainSc: 4                  Tascha Casares,JAMES TERRILL

## 2015-12-26 NOTE — Brief Op Note (Signed)
12/26/2015  2:42 PM  PATIENT:  Ellen Haney  55 y.o. female  PRE-OPERATIVE DIAGNOSIS:  let uvj stone  POST-OPERATIVE DIAGNOSIS:  let uvj stone  PROCEDURE:  Procedure(s): CYSTOSCOPY WITH URETEROSCOPY with STONE EXTRACTION AND STENT PLACEMENT (Left)  SURGEON:  Surgeon(s) and Role:    * Bjorn PippinJohn Joshaua Epple, MD - Primary  PHYSICIAN ASSISTANT:   ASSISTANTS: none   ANESTHESIA:   general  EBL:  Total I/O In: 200 [I.V.:200] Out: -   BLOOD ADMINISTERED:none  DRAINS: 6 x 24 left JJ stent with tether.    LOCAL MEDICATIONS USED:  NONE  SPECIMEN:  Source of Specimen:  stone  DISPOSITION OF SPECIMEN:  to patient to bring to office  COUNTS:  YES  TOURNIQUET:  * No tourniquets in log *  DICTATION: .Other Dictation: Dictation Number [id number]  PLAN OF CARE: Discharge to home after PACU  PATIENT DISPOSITION:  PACU - hemodynamically stable.   Delay start of Pharmacological VTE agent (>24hrs) due to surgical blood loss or risk of bleeding: not applicable

## 2015-12-27 ENCOUNTER — Other Ambulatory Visit: Payer: Self-pay | Admitting: Urology

## 2015-12-27 MED ORDER — OXYBUTYNIN CHLORIDE 5 MG PO TABS: 5.0000 mg | ORAL_TABLET | Freq: Three times a day (TID) | ORAL | Status: AC | PRN

## 2015-12-27 MED ORDER — TAMSULOSIN HCL 0.4 MG PO CAPS: 0.4000 mg | ORAL_CAPSULE | Freq: Every day | ORAL | Status: AC

## 2015-12-29 ENCOUNTER — Encounter (HOSPITAL_BASED_OUTPATIENT_CLINIC_OR_DEPARTMENT_OTHER): Payer: Self-pay | Admitting: Urology

## 2015-12-29 NOTE — Op Note (Signed)
NAME:  Ellen Haney, Ellen Haney                      ACCOUNT NO.:  MEDICAL RECORD NO.:  [id number]  LOCATION:                                 FACILITY:  PHYSICIAN:  Excell SeltzerJohn J. Annabell HowellsWrenn, M.D.    DATE OF BIRTH:  [birth year]  DATE OF PROCEDURE:  12/26/2015 DATE OF DISCHARGE:                              OPERATIVE REPORT   PROCEDURE:  Cystoscopy with left ureteroscopic stone extraction, insertion of left double-J stent.  PREOPERATIVE DIAGNOSIS:  Left distal ureteral stone.  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  SURGEON:  Excell SeltzerJohn J. Annabell HowellsWrenn, M.D.  ANESTHESIA:  General.  SPECIMEN:  Stone.  DRAINS:  A 6-French x 24 cm double-J stent.  BLOOD LOSS:  None.  COMPLICATIONS:  None.  INDICATIONS:  Ellen Haney is a pleasant 55 year old white female with a 3 x 10 mm left distal ureteral stone and was seen in the emergency room yesterday.  She had some pyuria and was placed on Keflex.  Her urine today is nitrite positive with some residual pyuria.  She has had no fever or symptoms of systemic sepsis, but does have continued pain.  She has elected to undergo attempted ureteroscopy with stent placement.  FINDINGS OF PROCEDURE:  She had been on Keflex, but I gave her Cipro in the office earlier today.  She was taken to the operating room where general anesthetic was induced.  She was placed in lithotomy position. Her perineum and genitalia were prepped with Betadine solution.  She was draped in usual sterile fashion.  She was noted to have a Bartholin cyst on the left, but it was not inflamed.  Cystoscopy was performed using 23-French scope and 30-degree lens. Examination with normal urethra.  The bladder wall had mild trabeculation.  There was scattered changes consistent with follicular cystitis.  The ureteral orifices were unremarkable.  No tumors or stones were noted.  The left ureteral orifice was cannulated with a guide wire.  There was no purulent efflux from  the ureter and it was felt with antibiotic coverage, a gentle attempted ureteroscopy was indicated.  The 4.5-French semirigid ureteroscope was then assembled and inserted with minimal irrigation.  The stone was identified.  It was grasped with the [place] basket and removed without difficulty.  Once the stone was out, a 6-French 24 cm contour double-J stent with tether was passed over the wire to the kidney under fluoroscopic guidance.  The wire was removed leaving good coil in the bladder, a good coil in the kidney.  The bladder was drained.  The stent string was tied close to the meatus and trimmed and tugged vaginally.  The stone was collected to get to the patient to bring the office for analysis.  She was taken down from lithotomy position.  Her anesthetic was reversed. She was moved to recovery room in stable condition.     Excell SeltzerJohn J. Annabell HowellsWrenn, M.D.     JJW/MEDQ  D:  12/26/2015  T:  12/27/2015  Job:  [id number]

## 2017-09-04 IMAGING — CT CT ABD-PELV W/O CM
2 of 4 series · 16 of 46 positions shown, 18 images · non-contrast
Comparison: None.

CLINICAL DATA: Left flank pain

EXAM:
CT ABDOMEN AND PELVIS WITHOUT CONTRAST
TECHNIQUE: Multidetector CT imaging of the abdomen and pelvis was performed
following the standard protocol without IV contrast.

[Series 2: renal stone 5mm · axial · 0.66mm/px · z∈[+979,+1369]mm · 13 of 86 slices shown, 15 images]
[im 4/86  soft-tissue]
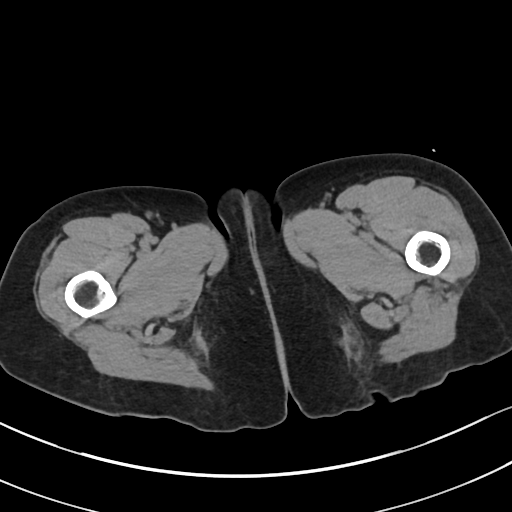
[im 4/86  bone]
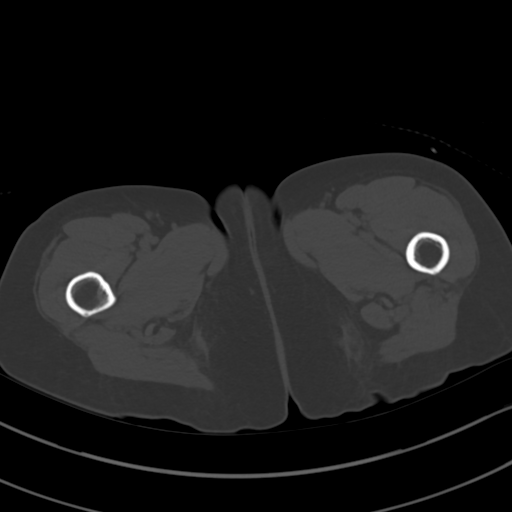
[im 12/86  soft-tissue]
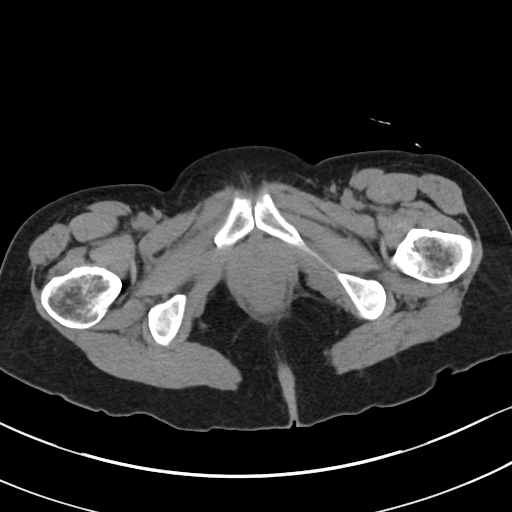
[im 20/86  soft-tissue]
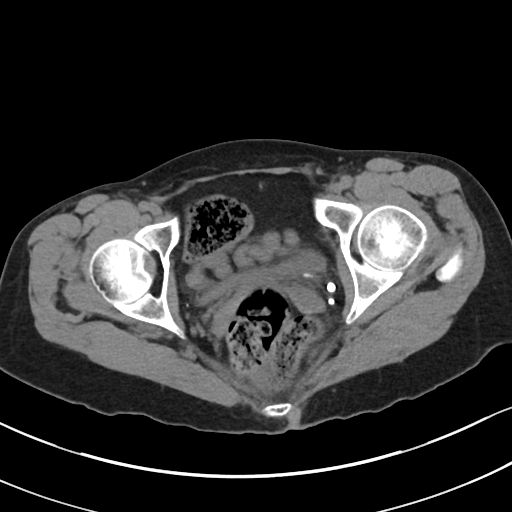
[im 24/86  soft-tissue]
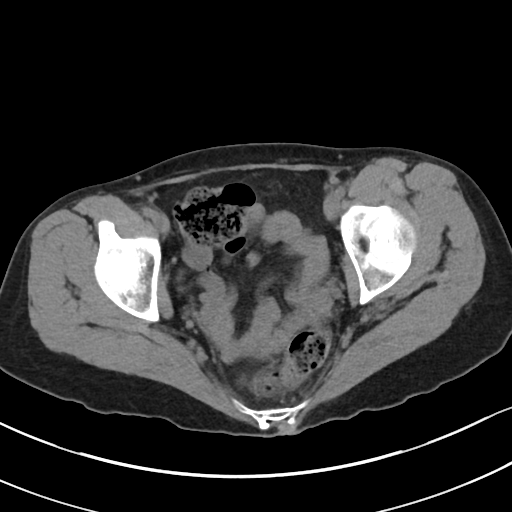
[im 31/86  soft-tissue]
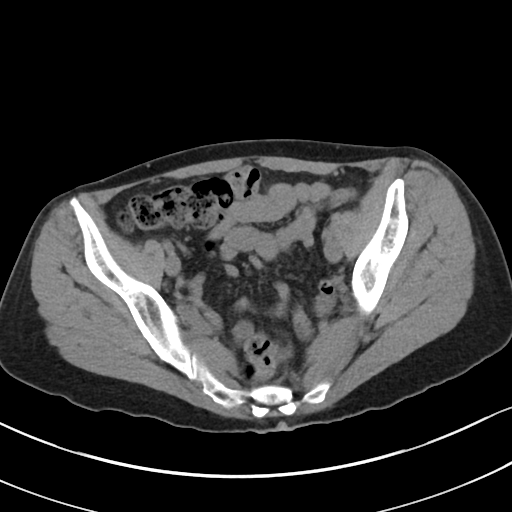
[im 35/86  soft-tissue]
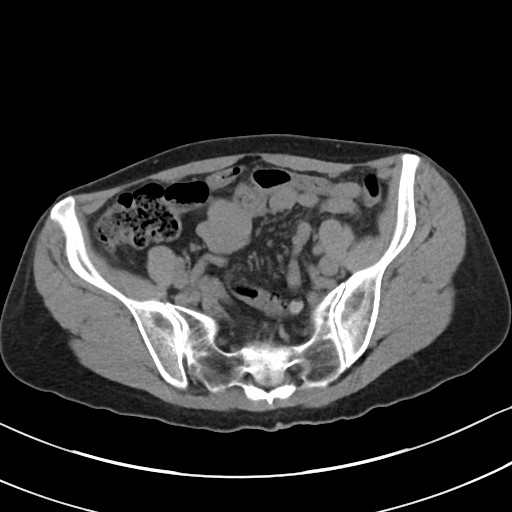
[im 43/86  soft-tissue]
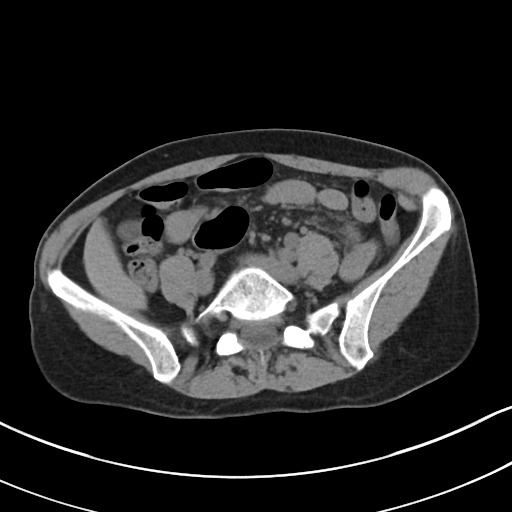
[im 51/86  soft-tissue]
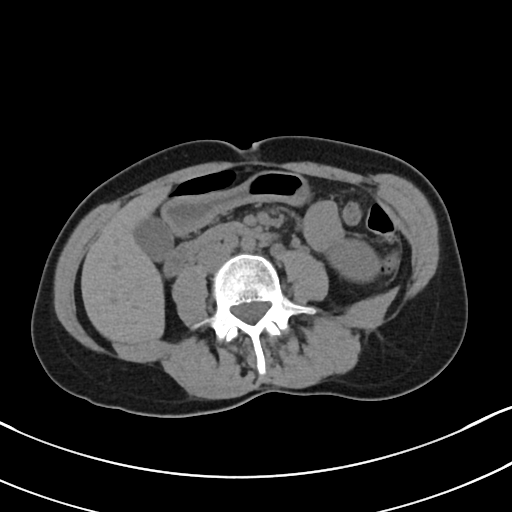
[im 55/86  soft-tissue]
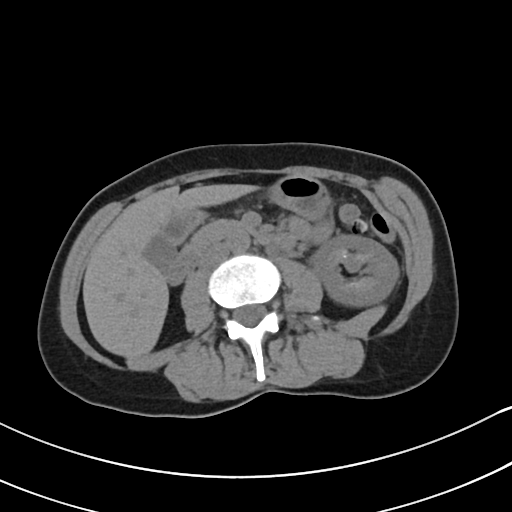
[im 55/86  bone]
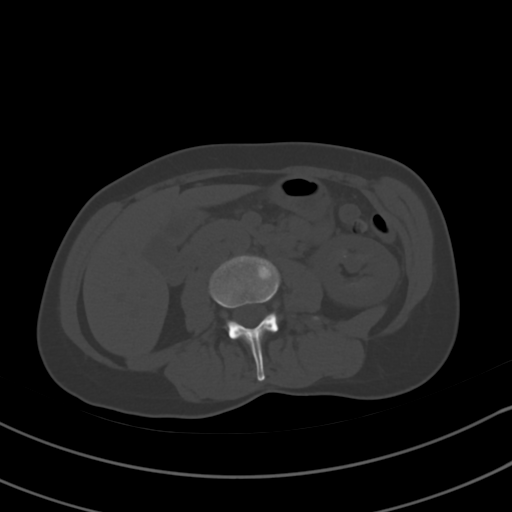
[im 62/86  soft-tissue]
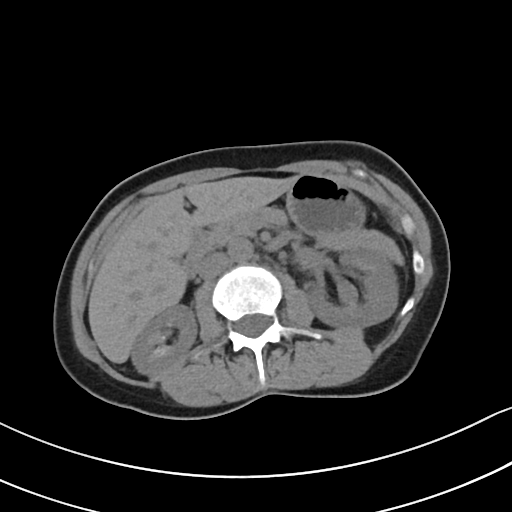
[im 66/86  soft-tissue]
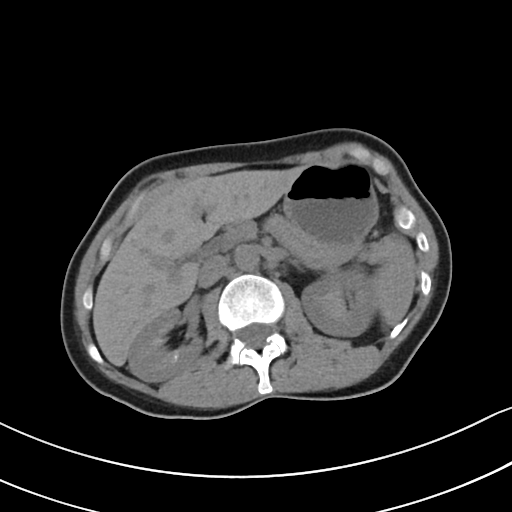
[im 74/86  soft-tissue]
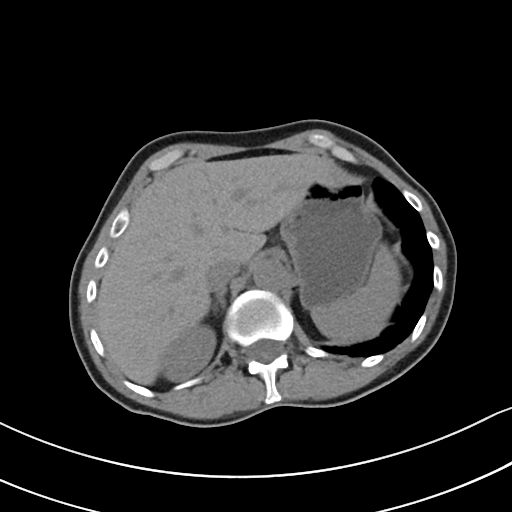
[im 82/86  soft-tissue]
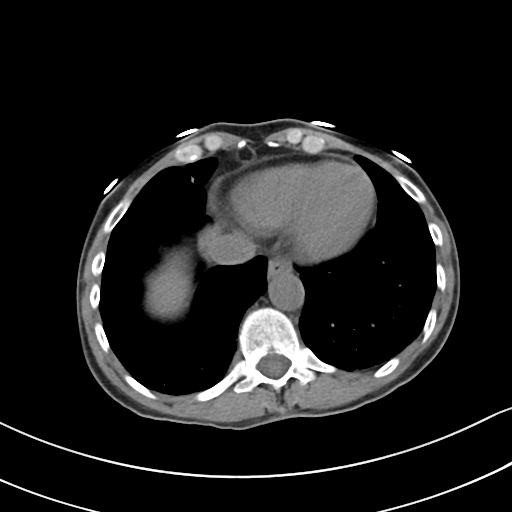

[Series 4: renal stone 3.0 cor · coronal · 0.62mm/px · 3 of 61 slices shown]
[im 21/61  soft-tissue]
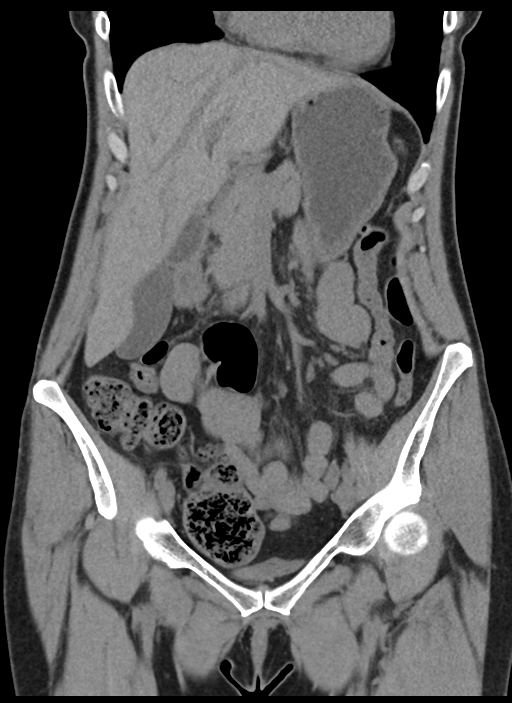
[im 27/61  soft-tissue]
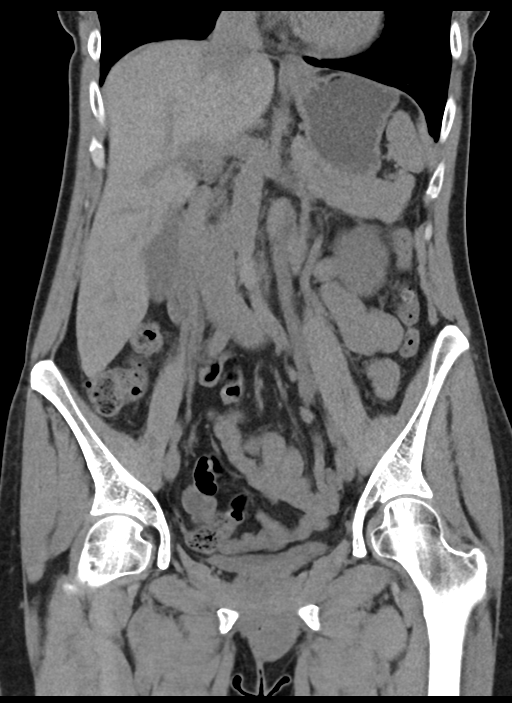
[im 34/61  soft-tissue]
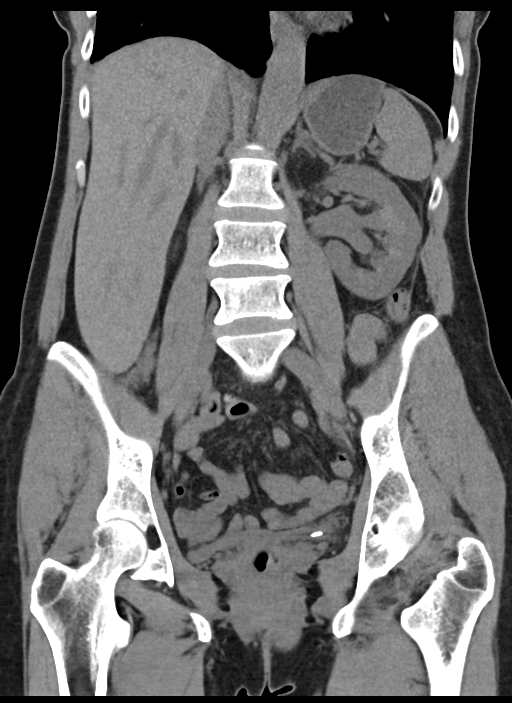

[16 of 46 positions shown; findings below may reference images not displayed]

FINDINGS: Lower chest: Mild scarring in the lung bases and right middle lobe.
No infiltrate or effusion. Heart size within normal limits.

Hepatobiliary: Liver normal in size and contour without focal
lesion. Gallbladder and bile ducts normal.

Pancreas: Negative

Spleen: Negative

Adrenals/Urinary Tract: Moderate hydronephrosis on the left. Left
ureter is dilated. 3 x 10 mm stone is present in the distal left
ureter causing obstruction. This is present at the left UVJ. No
obstruction of the right kidney. Nephrocalcinosis is present in the
bilaterally right greater than left. No renal mass. Urinary bladder
empty.

Stomach/Bowel: Negative for bowel obstruction. No bowel edema or
mass. Normal appendix.

Vascular/Lymphatic: Normal aorta and iliacs.  No lymphadenopathy

Reproductive: Mild to moderate prostate enlargement.

Other: Negative for free fluid

Musculoskeletal: No fracture or bone lesion.
IMPRESSION: 3 x 10 mm stone distal left ureter causing obstruction of the left
kidney.

Bilateral nephrocalcinosis right greater than left.
